# Patient Record
Sex: Female | Born: 1960
Health system: Southern US, Community
[De-identification: ages and names within clinical notes are randomized; demographics above are authoritative.]

---

## 1998-05-05 ENCOUNTER — Emergency Department (HOSPITAL_COMMUNITY): Admission: EM | Admit: 1998-05-05 | Discharge: 1998-05-05 | Payer: Self-pay | Admitting: Emergency Medicine

## 1998-09-06 ENCOUNTER — Emergency Department (HOSPITAL_COMMUNITY): Admission: EM | Admit: 1998-09-06 | Discharge: 1998-09-06 | Payer: Self-pay | Admitting: Emergency Medicine

## 2006-02-14 ENCOUNTER — Emergency Department (HOSPITAL_COMMUNITY): Admission: EM | Admit: 2006-02-14 | Discharge: 2006-02-14 | Payer: Self-pay | Admitting: Emergency Medicine

## 2011-12-19 DIAGNOSIS — M25569 Pain in unspecified knee: Secondary | ICD-10-CM | POA: Diagnosis not present

## 2011-12-26 DIAGNOSIS — M25569 Pain in unspecified knee: Secondary | ICD-10-CM | POA: Diagnosis not present

## 2012-01-16 DIAGNOSIS — G243 Spasmodic torticollis: Secondary | ICD-10-CM | POA: Diagnosis not present

## 2012-01-16 DIAGNOSIS — G244 Idiopathic orofacial dystonia: Secondary | ICD-10-CM | POA: Diagnosis not present

## 2012-03-19 DIAGNOSIS — G243 Spasmodic torticollis: Secondary | ICD-10-CM | POA: Diagnosis not present

## 2012-03-19 DIAGNOSIS — M436 Torticollis: Secondary | ICD-10-CM | POA: Diagnosis not present

## 2012-04-18 DIAGNOSIS — A630 Anogenital (venereal) warts: Secondary | ICD-10-CM | POA: Diagnosis not present

## 2012-07-02 DIAGNOSIS — G243 Spasmodic torticollis: Secondary | ICD-10-CM | POA: Diagnosis not present

## 2012-10-01 DIAGNOSIS — G243 Spasmodic torticollis: Secondary | ICD-10-CM | POA: Diagnosis not present

## 2013-01-21 DIAGNOSIS — R6889 Other general symptoms and signs: Secondary | ICD-10-CM | POA: Diagnosis not present

## 2013-01-21 DIAGNOSIS — G243 Spasmodic torticollis: Secondary | ICD-10-CM | POA: Diagnosis not present

## 2013-02-13 DIAGNOSIS — E559 Vitamin D deficiency, unspecified: Secondary | ICD-10-CM | POA: Diagnosis not present

## 2013-02-13 DIAGNOSIS — E7800 Pure hypercholesterolemia, unspecified: Secondary | ICD-10-CM | POA: Diagnosis not present

## 2013-02-13 DIAGNOSIS — I10 Essential (primary) hypertension: Secondary | ICD-10-CM | POA: Diagnosis not present

## 2013-04-15 DIAGNOSIS — R6889 Other general symptoms and signs: Secondary | ICD-10-CM | POA: Diagnosis not present

## 2013-04-15 DIAGNOSIS — G243 Spasmodic torticollis: Secondary | ICD-10-CM | POA: Diagnosis not present

## 2013-05-24 DIAGNOSIS — E559 Vitamin D deficiency, unspecified: Secondary | ICD-10-CM | POA: Diagnosis not present

## 2013-05-24 DIAGNOSIS — I10 Essential (primary) hypertension: Secondary | ICD-10-CM | POA: Diagnosis not present

## 2013-05-24 DIAGNOSIS — E7800 Pure hypercholesterolemia, unspecified: Secondary | ICD-10-CM | POA: Diagnosis not present

## 2013-05-24 DIAGNOSIS — R7989 Other specified abnormal findings of blood chemistry: Secondary | ICD-10-CM | POA: Diagnosis not present

## 2013-05-30 DIAGNOSIS — E7800 Pure hypercholesterolemia, unspecified: Secondary | ICD-10-CM | POA: Diagnosis not present

## 2013-05-30 DIAGNOSIS — E559 Vitamin D deficiency, unspecified: Secondary | ICD-10-CM | POA: Diagnosis not present

## 2013-06-19 DIAGNOSIS — H1045 Other chronic allergic conjunctivitis: Secondary | ICD-10-CM | POA: Diagnosis not present

## 2013-08-19 DIAGNOSIS — R6889 Other general symptoms and signs: Secondary | ICD-10-CM | POA: Diagnosis not present

## 2013-08-19 DIAGNOSIS — G243 Spasmodic torticollis: Secondary | ICD-10-CM | POA: Diagnosis not present

## 2013-09-20 DIAGNOSIS — I10 Essential (primary) hypertension: Secondary | ICD-10-CM | POA: Diagnosis not present

## 2013-09-20 DIAGNOSIS — E7800 Pure hypercholesterolemia, unspecified: Secondary | ICD-10-CM | POA: Diagnosis not present

## 2013-09-20 DIAGNOSIS — E559 Vitamin D deficiency, unspecified: Secondary | ICD-10-CM | POA: Diagnosis not present

## 2013-10-08 DIAGNOSIS — E559 Vitamin D deficiency, unspecified: Secondary | ICD-10-CM | POA: Diagnosis not present

## 2013-10-08 DIAGNOSIS — K219 Gastro-esophageal reflux disease without esophagitis: Secondary | ICD-10-CM | POA: Diagnosis not present

## 2013-10-08 DIAGNOSIS — E7800 Pure hypercholesterolemia, unspecified: Secondary | ICD-10-CM | POA: Diagnosis not present

## 2013-10-08 DIAGNOSIS — I10 Essential (primary) hypertension: Secondary | ICD-10-CM | POA: Diagnosis not present

## 2013-11-11 DIAGNOSIS — R6889 Other general symptoms and signs: Secondary | ICD-10-CM | POA: Diagnosis not present

## 2013-11-11 DIAGNOSIS — G243 Spasmodic torticollis: Secondary | ICD-10-CM | POA: Diagnosis not present

## 2014-01-09 DIAGNOSIS — E559 Vitamin D deficiency, unspecified: Secondary | ICD-10-CM | POA: Diagnosis not present

## 2014-01-09 DIAGNOSIS — E7800 Pure hypercholesterolemia, unspecified: Secondary | ICD-10-CM | POA: Diagnosis not present

## 2014-01-09 DIAGNOSIS — I10 Essential (primary) hypertension: Secondary | ICD-10-CM | POA: Diagnosis not present

## 2014-01-24 DIAGNOSIS — I10 Essential (primary) hypertension: Secondary | ICD-10-CM | POA: Diagnosis not present

## 2014-01-24 DIAGNOSIS — E7800 Pure hypercholesterolemia, unspecified: Secondary | ICD-10-CM | POA: Diagnosis not present

## 2014-01-24 DIAGNOSIS — R7989 Other specified abnormal findings of blood chemistry: Secondary | ICD-10-CM | POA: Diagnosis not present

## 2014-02-10 DIAGNOSIS — G243 Spasmodic torticollis: Secondary | ICD-10-CM | POA: Diagnosis not present

## 2014-02-10 DIAGNOSIS — R6889 Other general symptoms and signs: Secondary | ICD-10-CM | POA: Diagnosis not present

## 2014-02-11 DIAGNOSIS — E785 Hyperlipidemia, unspecified: Secondary | ICD-10-CM | POA: Diagnosis not present

## 2014-02-11 DIAGNOSIS — E781 Pure hyperglyceridemia: Secondary | ICD-10-CM | POA: Diagnosis not present

## 2014-02-11 DIAGNOSIS — R079 Chest pain, unspecified: Secondary | ICD-10-CM | POA: Diagnosis not present

## 2014-02-11 DIAGNOSIS — I10 Essential (primary) hypertension: Secondary | ICD-10-CM | POA: Diagnosis not present

## 2014-02-18 DIAGNOSIS — R079 Chest pain, unspecified: Secondary | ICD-10-CM | POA: Diagnosis not present

## 2014-02-18 DIAGNOSIS — R011 Cardiac murmur, unspecified: Secondary | ICD-10-CM | POA: Diagnosis not present

## 2014-03-31 DIAGNOSIS — E781 Pure hyperglyceridemia: Secondary | ICD-10-CM | POA: Diagnosis not present

## 2014-03-31 DIAGNOSIS — I10 Essential (primary) hypertension: Secondary | ICD-10-CM | POA: Diagnosis not present

## 2014-03-31 DIAGNOSIS — E785 Hyperlipidemia, unspecified: Secondary | ICD-10-CM | POA: Diagnosis not present

## 2014-05-19 DIAGNOSIS — R6889 Other general symptoms and signs: Secondary | ICD-10-CM | POA: Diagnosis not present

## 2014-05-19 DIAGNOSIS — G243 Spasmodic torticollis: Secondary | ICD-10-CM | POA: Diagnosis not present

## 2014-07-03 DIAGNOSIS — L259 Unspecified contact dermatitis, unspecified cause: Secondary | ICD-10-CM | POA: Diagnosis not present

## 2014-07-24 DIAGNOSIS — B009 Herpesviral infection, unspecified: Secondary | ICD-10-CM | POA: Diagnosis not present

## 2014-08-11 DIAGNOSIS — G243 Spasmodic torticollis: Secondary | ICD-10-CM | POA: Diagnosis not present

## 2014-08-15 DIAGNOSIS — Z1231 Encounter for screening mammogram for malignant neoplasm of breast: Secondary | ICD-10-CM | POA: Diagnosis not present

## 2014-09-12 DIAGNOSIS — Z23 Encounter for immunization: Secondary | ICD-10-CM | POA: Diagnosis not present

## 2014-09-12 DIAGNOSIS — E780 Pure hypercholesterolemia: Secondary | ICD-10-CM | POA: Diagnosis not present

## 2014-09-12 DIAGNOSIS — I10 Essential (primary) hypertension: Secondary | ICD-10-CM | POA: Diagnosis not present

## 2014-09-12 DIAGNOSIS — F329 Major depressive disorder, single episode, unspecified: Secondary | ICD-10-CM | POA: Diagnosis not present

## 2014-09-22 DIAGNOSIS — I10 Essential (primary) hypertension: Secondary | ICD-10-CM | POA: Diagnosis not present

## 2014-09-22 DIAGNOSIS — E785 Hyperlipidemia, unspecified: Secondary | ICD-10-CM | POA: Diagnosis not present

## 2014-11-17 DIAGNOSIS — G243 Spasmodic torticollis: Secondary | ICD-10-CM | POA: Diagnosis not present

## 2015-02-23 DIAGNOSIS — G243 Spasmodic torticollis: Secondary | ICD-10-CM | POA: Diagnosis not present

## 2015-05-18 DIAGNOSIS — G243 Spasmodic torticollis: Secondary | ICD-10-CM | POA: Diagnosis not present

## 2015-09-07 DIAGNOSIS — G243 Spasmodic torticollis: Secondary | ICD-10-CM | POA: Diagnosis not present

## 2015-11-19 DIAGNOSIS — L989 Disorder of the skin and subcutaneous tissue, unspecified: Secondary | ICD-10-CM | POA: Diagnosis not present

## 2015-11-19 DIAGNOSIS — E663 Overweight: Secondary | ICD-10-CM | POA: Diagnosis not present

## 2015-11-19 DIAGNOSIS — E7800 Pure hypercholesterolemia, unspecified: Secondary | ICD-10-CM | POA: Diagnosis not present

## 2015-11-19 DIAGNOSIS — Z6826 Body mass index (BMI) 26.0-26.9, adult: Secondary | ICD-10-CM | POA: Diagnosis not present

## 2015-11-19 DIAGNOSIS — I10 Essential (primary) hypertension: Secondary | ICD-10-CM | POA: Diagnosis not present

## 2015-11-19 DIAGNOSIS — Z23 Encounter for immunization: Secondary | ICD-10-CM | POA: Diagnosis not present

## 2016-01-04 DIAGNOSIS — G243 Spasmodic torticollis: Secondary | ICD-10-CM | POA: Diagnosis not present

## 2016-03-14 DIAGNOSIS — G243 Spasmodic torticollis: Secondary | ICD-10-CM | POA: Diagnosis not present

## 2016-04-01 DIAGNOSIS — E663 Overweight: Secondary | ICD-10-CM | POA: Diagnosis not present

## 2016-04-01 DIAGNOSIS — Z6825 Body mass index (BMI) 25.0-25.9, adult: Secondary | ICD-10-CM | POA: Diagnosis not present

## 2016-04-01 DIAGNOSIS — J302 Other seasonal allergic rhinitis: Secondary | ICD-10-CM | POA: Diagnosis not present

## 2016-04-01 DIAGNOSIS — R195 Other fecal abnormalities: Secondary | ICD-10-CM | POA: Diagnosis not present

## 2016-05-18 DIAGNOSIS — Z1389 Encounter for screening for other disorder: Secondary | ICD-10-CM | POA: Diagnosis not present

## 2016-05-18 DIAGNOSIS — I10 Essential (primary) hypertension: Secondary | ICD-10-CM | POA: Diagnosis not present

## 2016-05-18 DIAGNOSIS — Z139 Encounter for screening, unspecified: Secondary | ICD-10-CM | POA: Diagnosis not present

## 2016-05-18 DIAGNOSIS — Z6826 Body mass index (BMI) 26.0-26.9, adult: Secondary | ICD-10-CM | POA: Diagnosis not present

## 2016-05-18 DIAGNOSIS — Z0000 Encounter for general adult medical examination without abnormal findings: Secondary | ICD-10-CM | POA: Diagnosis not present

## 2016-05-18 DIAGNOSIS — E7800 Pure hypercholesterolemia, unspecified: Secondary | ICD-10-CM | POA: Diagnosis not present

## 2016-06-06 DIAGNOSIS — G243 Spasmodic torticollis: Secondary | ICD-10-CM | POA: Diagnosis not present

## 2016-06-15 DIAGNOSIS — Z1231 Encounter for screening mammogram for malignant neoplasm of breast: Secondary | ICD-10-CM | POA: Diagnosis not present

## 2016-09-12 DIAGNOSIS — G243 Spasmodic torticollis: Secondary | ICD-10-CM | POA: Diagnosis not present

## 2016-10-25 DIAGNOSIS — E663 Overweight: Secondary | ICD-10-CM | POA: Diagnosis not present

## 2016-10-25 DIAGNOSIS — Z6828 Body mass index (BMI) 28.0-28.9, adult: Secondary | ICD-10-CM | POA: Diagnosis not present

## 2016-10-25 DIAGNOSIS — F329 Major depressive disorder, single episode, unspecified: Secondary | ICD-10-CM | POA: Diagnosis not present

## 2016-11-11 DIAGNOSIS — R21 Rash and other nonspecific skin eruption: Secondary | ICD-10-CM | POA: Diagnosis not present

## 2016-11-11 DIAGNOSIS — R233 Spontaneous ecchymoses: Secondary | ICD-10-CM | POA: Diagnosis not present

## 2016-11-30 DIAGNOSIS — E663 Overweight: Secondary | ICD-10-CM | POA: Diagnosis not present

## 2016-11-30 DIAGNOSIS — Z6829 Body mass index (BMI) 29.0-29.9, adult: Secondary | ICD-10-CM | POA: Diagnosis not present

## 2016-11-30 DIAGNOSIS — Z09 Encounter for follow-up examination after completed treatment for conditions other than malignant neoplasm: Secondary | ICD-10-CM | POA: Diagnosis not present

## 2016-12-21 DIAGNOSIS — R945 Abnormal results of liver function studies: Secondary | ICD-10-CM | POA: Diagnosis not present

## 2016-12-21 DIAGNOSIS — R7989 Other specified abnormal findings of blood chemistry: Secondary | ICD-10-CM | POA: Diagnosis not present

## 2017-01-02 DIAGNOSIS — G243 Spasmodic torticollis: Secondary | ICD-10-CM | POA: Diagnosis not present

## 2017-01-25 DIAGNOSIS — E663 Overweight: Secondary | ICD-10-CM | POA: Diagnosis not present

## 2017-01-25 DIAGNOSIS — R7309 Other abnormal glucose: Secondary | ICD-10-CM | POA: Diagnosis not present

## 2017-01-25 DIAGNOSIS — Z6828 Body mass index (BMI) 28.0-28.9, adult: Secondary | ICD-10-CM | POA: Diagnosis not present

## 2017-01-25 DIAGNOSIS — I10 Essential (primary) hypertension: Secondary | ICD-10-CM | POA: Diagnosis not present

## 2017-01-25 DIAGNOSIS — Z23 Encounter for immunization: Secondary | ICD-10-CM | POA: Diagnosis not present

## 2017-02-17 DIAGNOSIS — R945 Abnormal results of liver function studies: Secondary | ICD-10-CM | POA: Diagnosis not present

## 2017-02-17 DIAGNOSIS — N183 Chronic kidney disease, stage 3 (moderate): Secondary | ICD-10-CM | POA: Diagnosis not present

## 2017-02-22 ENCOUNTER — Other Ambulatory Visit: Payer: Self-pay | Admitting: Nephrology

## 2017-02-22 DIAGNOSIS — N183 Chronic kidney disease, stage 3 unspecified: Secondary | ICD-10-CM

## 2017-03-06 ENCOUNTER — Other Ambulatory Visit: Payer: Self-pay

## 2017-03-13 ENCOUNTER — Other Ambulatory Visit: Payer: Self-pay | Admitting: Nephrology

## 2017-03-13 ENCOUNTER — Ambulatory Visit
Admission: RE | Admit: 2017-03-13 | Discharge: 2017-03-13 | Disposition: A | Discharge status: Home / Self Care | Payer: Medicare Other | Source: Ambulatory Visit | Attending: Nephrology | Admitting: Nephrology

## 2017-03-13 DIAGNOSIS — G243 Spasmodic torticollis: Secondary | ICD-10-CM | POA: Diagnosis not present

## 2017-03-13 DIAGNOSIS — R748 Abnormal levels of other serum enzymes: Secondary | ICD-10-CM

## 2017-03-13 DIAGNOSIS — N183 Chronic kidney disease, stage 3 unspecified: Secondary | ICD-10-CM

## 2017-03-13 DIAGNOSIS — R945 Abnormal results of liver function studies: Secondary | ICD-10-CM | POA: Diagnosis not present

## 2017-05-10 DIAGNOSIS — K219 Gastro-esophageal reflux disease without esophagitis: Secondary | ICD-10-CM | POA: Diagnosis not present

## 2017-05-10 DIAGNOSIS — E119 Type 2 diabetes mellitus without complications: Secondary | ICD-10-CM | POA: Diagnosis not present

## 2017-05-10 DIAGNOSIS — F329 Major depressive disorder, single episode, unspecified: Secondary | ICD-10-CM | POA: Diagnosis not present

## 2017-05-10 DIAGNOSIS — Z6826 Body mass index (BMI) 26.0-26.9, adult: Secondary | ICD-10-CM | POA: Diagnosis not present

## 2017-05-10 DIAGNOSIS — I10 Essential (primary) hypertension: Secondary | ICD-10-CM | POA: Diagnosis not present

## 2017-06-28 DIAGNOSIS — G243 Spasmodic torticollis: Secondary | ICD-10-CM | POA: Diagnosis not present

## 2017-07-25 DIAGNOSIS — Z79899 Other long term (current) drug therapy: Secondary | ICD-10-CM | POA: Diagnosis not present

## 2017-07-25 DIAGNOSIS — G243 Spasmodic torticollis: Secondary | ICD-10-CM | POA: Diagnosis not present

## 2017-07-25 DIAGNOSIS — K6289 Other specified diseases of anus and rectum: Secondary | ICD-10-CM | POA: Diagnosis not present

## 2017-07-25 DIAGNOSIS — K5900 Constipation, unspecified: Secondary | ICD-10-CM | POA: Diagnosis not present

## 2017-07-25 DIAGNOSIS — R739 Hyperglycemia, unspecified: Secondary | ICD-10-CM | POA: Diagnosis not present

## 2017-07-25 DIAGNOSIS — E785 Hyperlipidemia, unspecified: Secondary | ICD-10-CM | POA: Diagnosis not present

## 2017-07-25 DIAGNOSIS — F329 Major depressive disorder, single episode, unspecified: Secondary | ICD-10-CM | POA: Diagnosis not present

## 2017-07-25 DIAGNOSIS — K219 Gastro-esophageal reflux disease without esophagitis: Secondary | ICD-10-CM | POA: Diagnosis not present

## 2017-07-25 DIAGNOSIS — I10 Essential (primary) hypertension: Secondary | ICD-10-CM | POA: Diagnosis not present

## 2017-09-05 DIAGNOSIS — Z1231 Encounter for screening mammogram for malignant neoplasm of breast: Secondary | ICD-10-CM | POA: Diagnosis not present

## 2017-10-02 DIAGNOSIS — E785 Hyperlipidemia, unspecified: Secondary | ICD-10-CM | POA: Diagnosis not present

## 2017-10-02 DIAGNOSIS — I10 Essential (primary) hypertension: Secondary | ICD-10-CM | POA: Diagnosis not present

## 2017-10-24 DIAGNOSIS — I10 Essential (primary) hypertension: Secondary | ICD-10-CM | POA: Diagnosis not present

## 2017-10-24 DIAGNOSIS — R739 Hyperglycemia, unspecified: Secondary | ICD-10-CM | POA: Diagnosis not present

## 2017-10-24 DIAGNOSIS — K219 Gastro-esophageal reflux disease without esophagitis: Secondary | ICD-10-CM | POA: Diagnosis not present

## 2017-10-24 DIAGNOSIS — Z23 Encounter for immunization: Secondary | ICD-10-CM | POA: Diagnosis not present

## 2017-10-24 DIAGNOSIS — F329 Major depressive disorder, single episode, unspecified: Secondary | ICD-10-CM | POA: Diagnosis not present

## 2017-10-24 DIAGNOSIS — E785 Hyperlipidemia, unspecified: Secondary | ICD-10-CM | POA: Diagnosis not present

## 2017-10-24 DIAGNOSIS — E559 Vitamin D deficiency, unspecified: Secondary | ICD-10-CM | POA: Diagnosis not present

## 2017-11-06 DIAGNOSIS — G243 Spasmodic torticollis: Secondary | ICD-10-CM | POA: Diagnosis not present

## 2017-12-14 DIAGNOSIS — E785 Hyperlipidemia, unspecified: Secondary | ICD-10-CM | POA: Diagnosis not present

## 2018-02-09 DIAGNOSIS — E785 Hyperlipidemia, unspecified: Secondary | ICD-10-CM | POA: Diagnosis not present

## 2018-02-12 DIAGNOSIS — G243 Spasmodic torticollis: Secondary | ICD-10-CM | POA: Diagnosis not present

## 2018-02-14 DIAGNOSIS — E785 Hyperlipidemia, unspecified: Secondary | ICD-10-CM | POA: Diagnosis not present

## 2018-02-14 DIAGNOSIS — Z8249 Family history of ischemic heart disease and other diseases of the circulatory system: Secondary | ICD-10-CM | POA: Diagnosis not present

## 2018-02-14 DIAGNOSIS — I10 Essential (primary) hypertension: Secondary | ICD-10-CM | POA: Diagnosis not present

## 2018-04-26 DIAGNOSIS — R7303 Prediabetes: Secondary | ICD-10-CM | POA: Diagnosis not present

## 2018-04-26 DIAGNOSIS — F329 Major depressive disorder, single episode, unspecified: Secondary | ICD-10-CM | POA: Diagnosis not present

## 2018-04-26 DIAGNOSIS — E785 Hyperlipidemia, unspecified: Secondary | ICD-10-CM | POA: Diagnosis not present

## 2018-04-26 DIAGNOSIS — I10 Essential (primary) hypertension: Secondary | ICD-10-CM | POA: Diagnosis not present

## 2018-04-26 DIAGNOSIS — K219 Gastro-esophageal reflux disease without esophagitis: Secondary | ICD-10-CM | POA: Diagnosis not present

## 2018-05-28 DIAGNOSIS — G243 Spasmodic torticollis: Secondary | ICD-10-CM | POA: Diagnosis not present

## 2018-05-28 DIAGNOSIS — Z79899 Other long term (current) drug therapy: Secondary | ICD-10-CM | POA: Diagnosis not present

## 2018-06-12 DIAGNOSIS — Z1231 Encounter for screening mammogram for malignant neoplasm of breast: Secondary | ICD-10-CM | POA: Diagnosis not present

## 2018-06-12 DIAGNOSIS — Z136 Encounter for screening for cardiovascular disorders: Secondary | ICD-10-CM | POA: Diagnosis not present

## 2018-06-12 DIAGNOSIS — Z0000 Encounter for general adult medical examination without abnormal findings: Secondary | ICD-10-CM | POA: Diagnosis not present

## 2018-06-12 DIAGNOSIS — N959 Unspecified menopausal and perimenopausal disorder: Secondary | ICD-10-CM | POA: Diagnosis not present

## 2018-06-12 DIAGNOSIS — Z9181 History of falling: Secondary | ICD-10-CM | POA: Diagnosis not present

## 2018-06-12 DIAGNOSIS — Z1331 Encounter for screening for depression: Secondary | ICD-10-CM | POA: Diagnosis not present

## 2018-06-12 DIAGNOSIS — E785 Hyperlipidemia, unspecified: Secondary | ICD-10-CM | POA: Diagnosis not present

## 2018-08-20 DIAGNOSIS — Z8249 Family history of ischemic heart disease and other diseases of the circulatory system: Secondary | ICD-10-CM | POA: Diagnosis not present

## 2018-08-20 DIAGNOSIS — E785 Hyperlipidemia, unspecified: Secondary | ICD-10-CM | POA: Diagnosis not present

## 2018-08-20 DIAGNOSIS — I10 Essential (primary) hypertension: Secondary | ICD-10-CM | POA: Diagnosis not present

## 2018-09-18 DIAGNOSIS — G243 Spasmodic torticollis: Secondary | ICD-10-CM | POA: Diagnosis not present

## 2018-10-01 DIAGNOSIS — Z1231 Encounter for screening mammogram for malignant neoplasm of breast: Secondary | ICD-10-CM | POA: Diagnosis not present

## 2018-10-01 DIAGNOSIS — Z13820 Encounter for screening for osteoporosis: Secondary | ICD-10-CM | POA: Diagnosis not present

## 2018-10-01 DIAGNOSIS — N959 Unspecified menopausal and perimenopausal disorder: Secondary | ICD-10-CM | POA: Diagnosis not present

## 2018-12-04 IMAGING — US US ABDOMEN LIMITED
1 series · 14 of 25 positions shown · non-contrast
Comparison: Ultrasound of the abdomen of 12/21/2016

CLINICAL DATA: Elevated liver function tests, prior
cholecystectomy. New diagnosis of diabetes

EXAM:
US ABDOMEN LIMITED - RIGHT UPPER QUADRANT

[Series 1: us abdomen limited · 0.22mm/px · 14 of 32 slices shown]
[im 1/32]
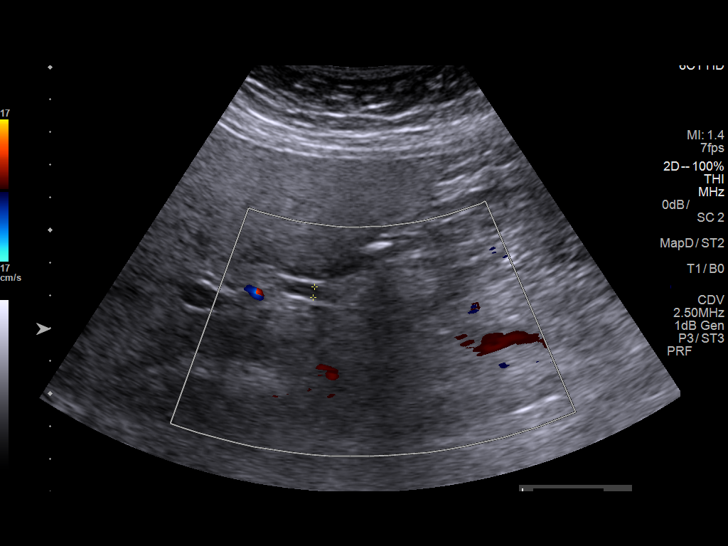
[im 3/32]
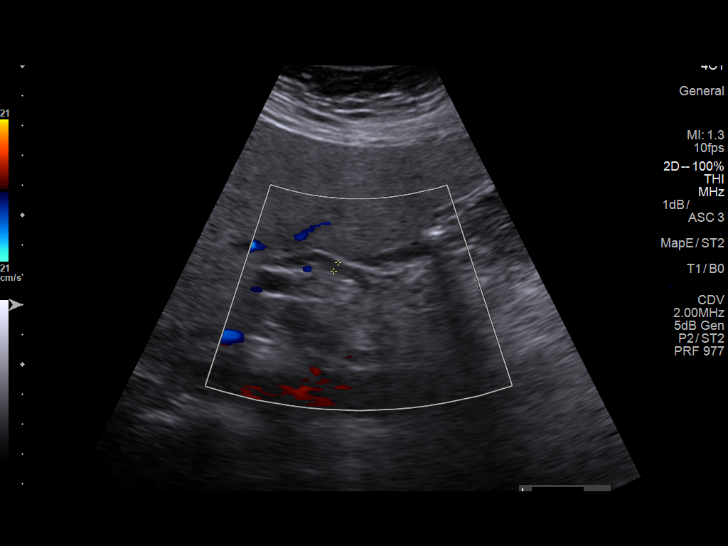
[im 6/32]
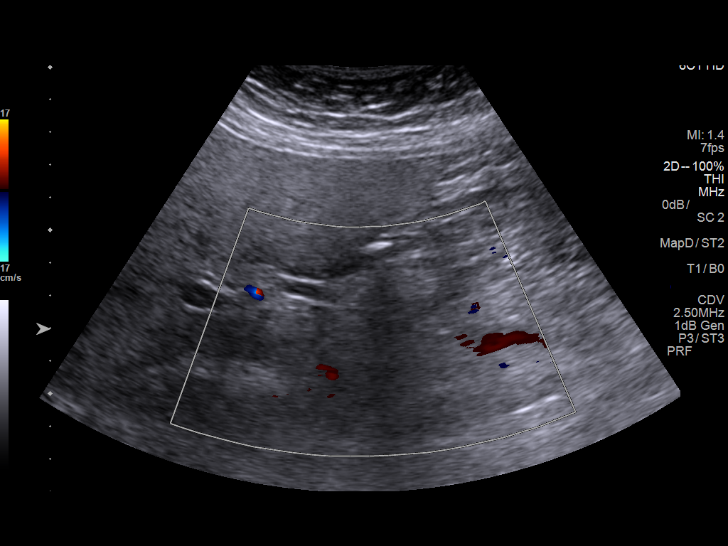
[im 8/32]
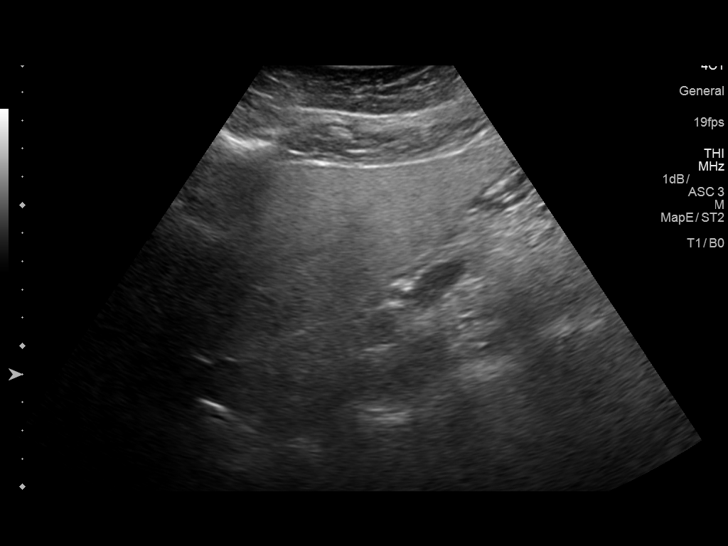
[im 11/32]
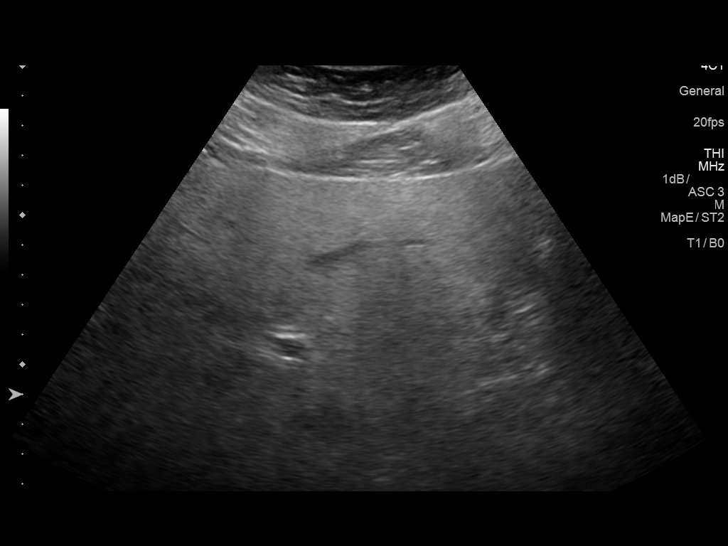
[im 12/32]
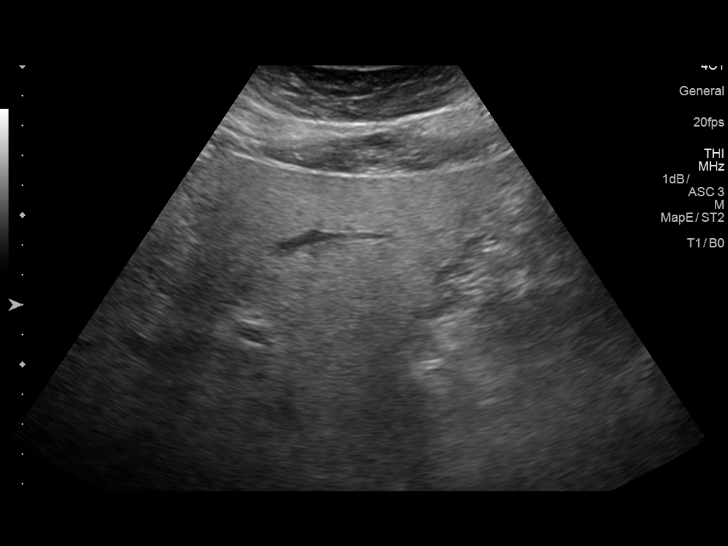
[im 15/32]
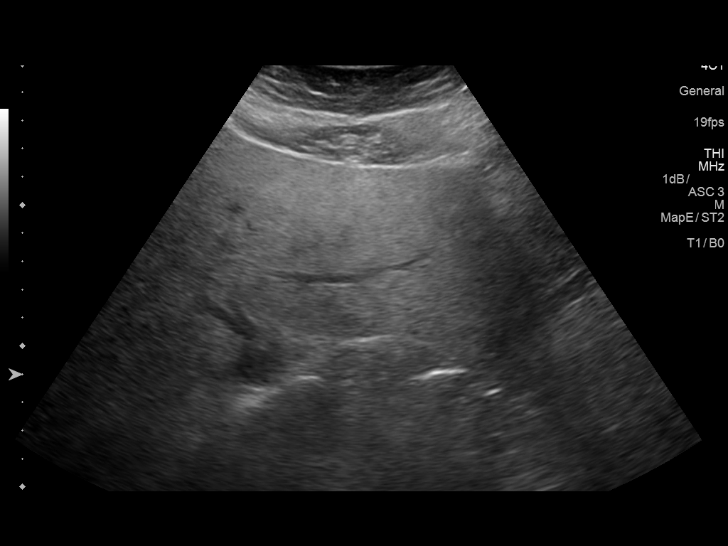
[im 17/32]
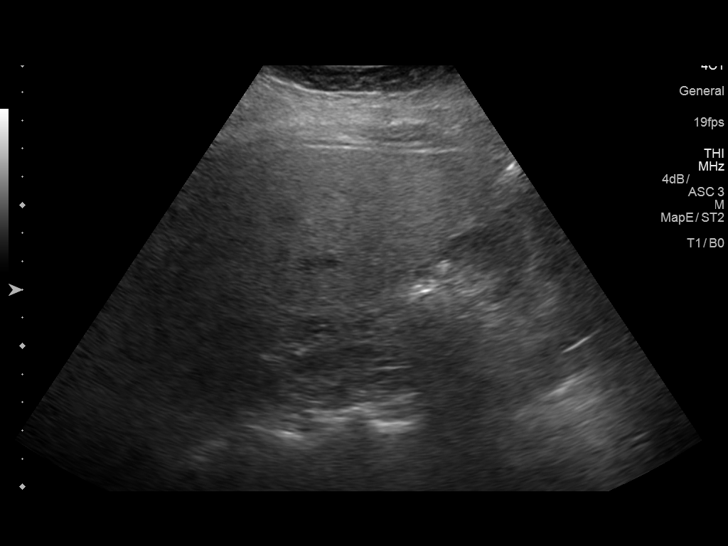
[im 20/32]
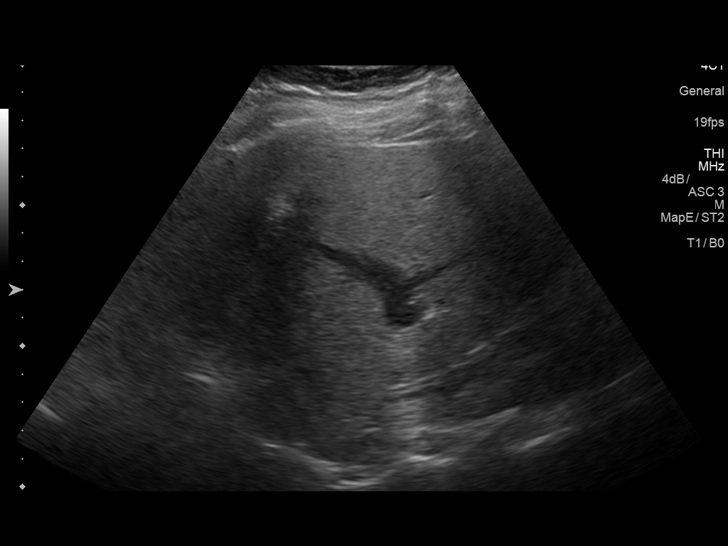
[im 21/32]
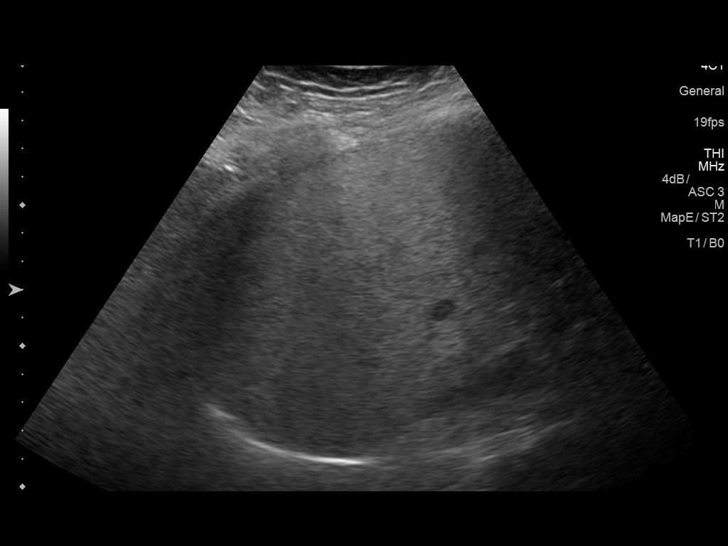
[im 24/32]
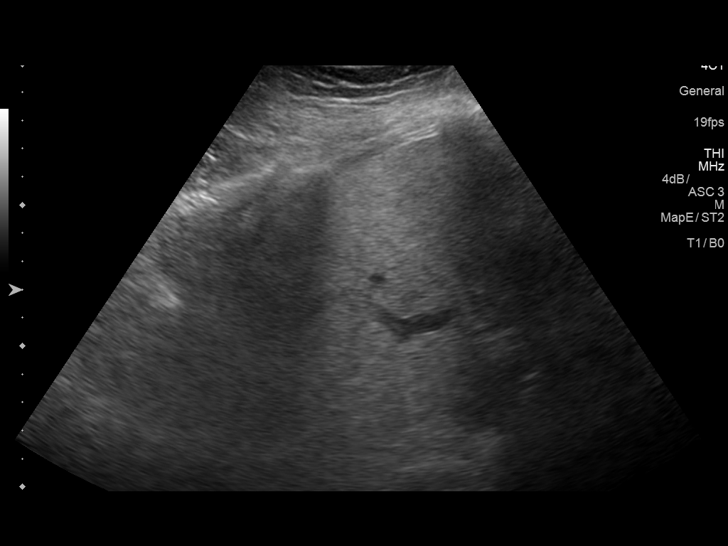
[im 26/32]
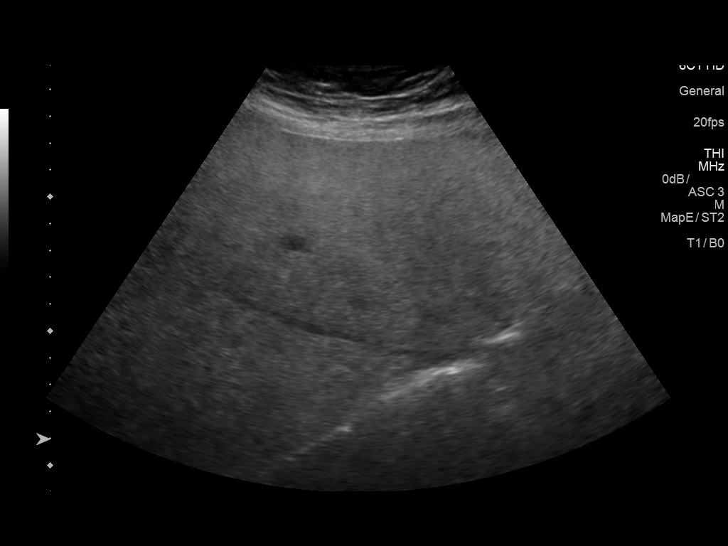
[im 29/32]
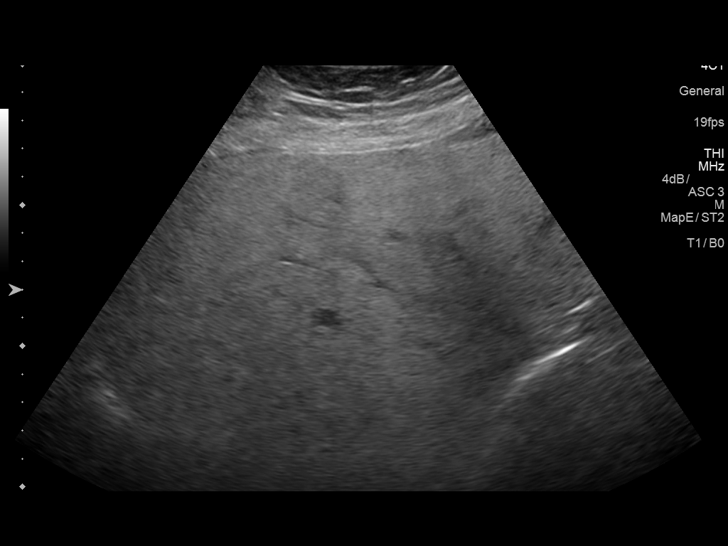
[im 32/32]
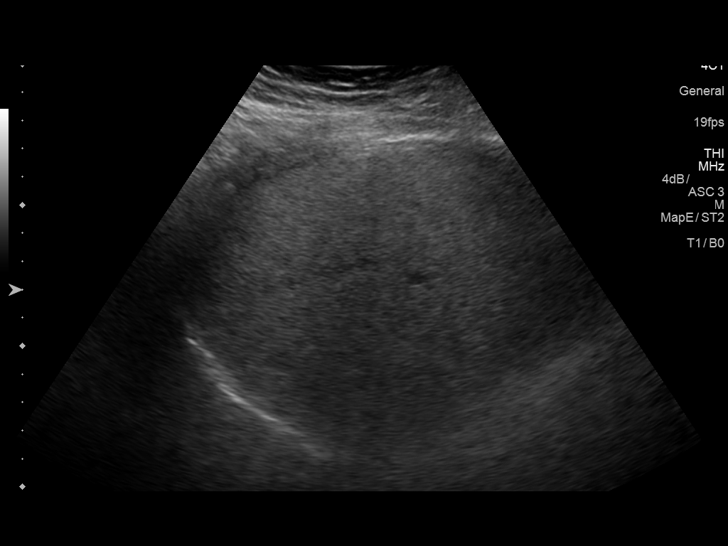

[14 of 25 positions shown; findings below may reference images not displayed]

FINDINGS: Gallbladder:

The gallbladder has previously been resected.

Common bile duct:

Diameter: The common bile duct is normal measuring 3.3 mm.

Liver:

The liver is diffusely echogenic and inhomogeneous consistent with
fatty infiltration. No focal hepatic abnormality is seen.
IMPRESSION: 1. Inhomogeneous echogenic liver parenchyma consistent with diffuse
fatty infiltration. No focal abnormality.
2. Prior cholecystectomy.

## 2018-12-10 DIAGNOSIS — R062 Wheezing: Secondary | ICD-10-CM | POA: Diagnosis not present

## 2018-12-10 DIAGNOSIS — J209 Acute bronchitis, unspecified: Secondary | ICD-10-CM | POA: Diagnosis not present

## 2018-12-10 DIAGNOSIS — R05 Cough: Secondary | ICD-10-CM | POA: Diagnosis not present

## 2019-01-17 DIAGNOSIS — G243 Spasmodic torticollis: Secondary | ICD-10-CM | POA: Diagnosis not present

## 2019-01-17 DIAGNOSIS — Z79899 Other long term (current) drug therapy: Secondary | ICD-10-CM | POA: Diagnosis not present

## 2019-05-07 DIAGNOSIS — G243 Spasmodic torticollis: Secondary | ICD-10-CM | POA: Diagnosis not present

## 2019-05-07 DIAGNOSIS — Z79899 Other long term (current) drug therapy: Secondary | ICD-10-CM | POA: Diagnosis not present

## 2019-05-16 DIAGNOSIS — Z9229 Personal history of other drug therapy: Secondary | ICD-10-CM | POA: Diagnosis not present

## 2019-05-16 DIAGNOSIS — Z79899 Other long term (current) drug therapy: Secondary | ICD-10-CM | POA: Diagnosis not present

## 2019-05-16 DIAGNOSIS — G243 Spasmodic torticollis: Secondary | ICD-10-CM | POA: Diagnosis not present

## 2019-05-21 DIAGNOSIS — E559 Vitamin D deficiency, unspecified: Secondary | ICD-10-CM | POA: Diagnosis not present

## 2019-05-21 DIAGNOSIS — K219 Gastro-esophageal reflux disease without esophagitis: Secondary | ICD-10-CM | POA: Diagnosis not present

## 2019-05-21 DIAGNOSIS — E785 Hyperlipidemia, unspecified: Secondary | ICD-10-CM | POA: Diagnosis not present

## 2019-05-21 DIAGNOSIS — I10 Essential (primary) hypertension: Secondary | ICD-10-CM | POA: Diagnosis not present

## 2019-06-03 DIAGNOSIS — Z79899 Other long term (current) drug therapy: Secondary | ICD-10-CM | POA: Diagnosis not present

## 2019-06-03 DIAGNOSIS — R5383 Other fatigue: Secondary | ICD-10-CM | POA: Diagnosis not present

## 2019-06-03 DIAGNOSIS — E559 Vitamin D deficiency, unspecified: Secondary | ICD-10-CM | POA: Diagnosis not present

## 2019-06-03 DIAGNOSIS — R739 Hyperglycemia, unspecified: Secondary | ICD-10-CM | POA: Diagnosis not present

## 2019-06-03 DIAGNOSIS — I10 Essential (primary) hypertension: Secondary | ICD-10-CM | POA: Diagnosis not present

## 2019-06-03 DIAGNOSIS — E785 Hyperlipidemia, unspecified: Secondary | ICD-10-CM | POA: Diagnosis not present

## 2019-06-03 DIAGNOSIS — K219 Gastro-esophageal reflux disease without esophagitis: Secondary | ICD-10-CM | POA: Diagnosis not present

## 2019-06-17 DIAGNOSIS — Z1331 Encounter for screening for depression: Secondary | ICD-10-CM | POA: Diagnosis not present

## 2019-06-17 DIAGNOSIS — E785 Hyperlipidemia, unspecified: Secondary | ICD-10-CM | POA: Diagnosis not present

## 2019-06-17 DIAGNOSIS — Z9181 History of falling: Secondary | ICD-10-CM | POA: Diagnosis not present

## 2019-06-17 DIAGNOSIS — Z1231 Encounter for screening mammogram for malignant neoplasm of breast: Secondary | ICD-10-CM | POA: Diagnosis not present

## 2019-06-17 DIAGNOSIS — Z0000 Encounter for general adult medical examination without abnormal findings: Secondary | ICD-10-CM | POA: Diagnosis not present

## 2019-08-15 DIAGNOSIS — Z79899 Other long term (current) drug therapy: Secondary | ICD-10-CM | POA: Diagnosis not present

## 2019-08-15 DIAGNOSIS — G243 Spasmodic torticollis: Secondary | ICD-10-CM | POA: Diagnosis not present

## 2019-08-29 DIAGNOSIS — E785 Hyperlipidemia, unspecified: Secondary | ICD-10-CM | POA: Diagnosis not present

## 2019-08-29 DIAGNOSIS — I10 Essential (primary) hypertension: Secondary | ICD-10-CM | POA: Diagnosis not present

## 2019-08-29 DIAGNOSIS — K219 Gastro-esophageal reflux disease without esophagitis: Secondary | ICD-10-CM | POA: Diagnosis not present

## 2019-08-29 DIAGNOSIS — E559 Vitamin D deficiency, unspecified: Secondary | ICD-10-CM | POA: Diagnosis not present

## 2019-08-29 DIAGNOSIS — Z23 Encounter for immunization: Secondary | ICD-10-CM | POA: Diagnosis not present

## 2019-08-29 DIAGNOSIS — R739 Hyperglycemia, unspecified: Secondary | ICD-10-CM | POA: Diagnosis not present

## 2019-08-29 DIAGNOSIS — R5383 Other fatigue: Secondary | ICD-10-CM | POA: Diagnosis not present

## 2019-09-16 DIAGNOSIS — Z8249 Family history of ischemic heart disease and other diseases of the circulatory system: Secondary | ICD-10-CM | POA: Diagnosis not present

## 2019-09-16 DIAGNOSIS — I10 Essential (primary) hypertension: Secondary | ICD-10-CM | POA: Diagnosis not present

## 2019-09-16 DIAGNOSIS — E785 Hyperlipidemia, unspecified: Secondary | ICD-10-CM | POA: Diagnosis not present

## 2019-10-24 DIAGNOSIS — H43393 Other vitreous opacities, bilateral: Secondary | ICD-10-CM | POA: Diagnosis not present

## 2019-10-24 DIAGNOSIS — H25013 Cortical age-related cataract, bilateral: Secondary | ICD-10-CM | POA: Diagnosis not present

## 2020-01-03 DIAGNOSIS — G243 Spasmodic torticollis: Secondary | ICD-10-CM | POA: Diagnosis not present

## 2020-01-14 DIAGNOSIS — H25811 Combined forms of age-related cataract, right eye: Secondary | ICD-10-CM | POA: Diagnosis not present

## 2020-01-14 DIAGNOSIS — Z01818 Encounter for other preprocedural examination: Secondary | ICD-10-CM | POA: Diagnosis not present

## 2020-01-21 DIAGNOSIS — K219 Gastro-esophageal reflux disease without esophagitis: Secondary | ICD-10-CM | POA: Diagnosis not present

## 2020-01-21 DIAGNOSIS — F418 Other specified anxiety disorders: Secondary | ICD-10-CM | POA: Diagnosis not present

## 2020-01-21 DIAGNOSIS — I10 Essential (primary) hypertension: Secondary | ICD-10-CM | POA: Diagnosis not present

## 2020-01-21 DIAGNOSIS — E785 Hyperlipidemia, unspecified: Secondary | ICD-10-CM | POA: Diagnosis not present

## 2020-01-21 DIAGNOSIS — Z79899 Other long term (current) drug therapy: Secondary | ICD-10-CM | POA: Diagnosis not present

## 2020-01-21 DIAGNOSIS — H259 Unspecified age-related cataract: Secondary | ICD-10-CM | POA: Diagnosis not present

## 2020-01-21 DIAGNOSIS — H25811 Combined forms of age-related cataract, right eye: Secondary | ICD-10-CM | POA: Diagnosis not present

## 2020-01-21 DIAGNOSIS — Z961 Presence of intraocular lens: Secondary | ICD-10-CM | POA: Diagnosis not present

## 2020-01-21 DIAGNOSIS — M1990 Unspecified osteoarthritis, unspecified site: Secondary | ICD-10-CM | POA: Diagnosis not present

## 2020-02-18 DIAGNOSIS — E785 Hyperlipidemia, unspecified: Secondary | ICD-10-CM | POA: Diagnosis not present

## 2020-02-18 DIAGNOSIS — E119 Type 2 diabetes mellitus without complications: Secondary | ICD-10-CM | POA: Diagnosis not present

## 2020-02-18 DIAGNOSIS — E559 Vitamin D deficiency, unspecified: Secondary | ICD-10-CM | POA: Diagnosis not present

## 2020-02-18 DIAGNOSIS — K219 Gastro-esophageal reflux disease without esophagitis: Secondary | ICD-10-CM | POA: Diagnosis not present

## 2020-02-18 DIAGNOSIS — I10 Essential (primary) hypertension: Secondary | ICD-10-CM | POA: Diagnosis not present

## 2020-02-25 DIAGNOSIS — Z1212 Encounter for screening for malignant neoplasm of rectum: Secondary | ICD-10-CM | POA: Diagnosis not present

## 2020-03-03 DIAGNOSIS — H25812 Combined forms of age-related cataract, left eye: Secondary | ICD-10-CM | POA: Diagnosis not present

## 2020-03-03 DIAGNOSIS — Z79899 Other long term (current) drug therapy: Secondary | ICD-10-CM | POA: Diagnosis not present

## 2020-03-03 DIAGNOSIS — H52203 Unspecified astigmatism, bilateral: Secondary | ICD-10-CM | POA: Diagnosis not present

## 2020-03-03 DIAGNOSIS — E785 Hyperlipidemia, unspecified: Secondary | ICD-10-CM | POA: Diagnosis not present

## 2020-03-03 DIAGNOSIS — I10 Essential (primary) hypertension: Secondary | ICD-10-CM | POA: Diagnosis not present

## 2020-03-03 DIAGNOSIS — H2512 Age-related nuclear cataract, left eye: Secondary | ICD-10-CM | POA: Diagnosis not present

## 2020-03-03 DIAGNOSIS — H259 Unspecified age-related cataract: Secondary | ICD-10-CM | POA: Diagnosis not present

## 2020-03-03 DIAGNOSIS — M1990 Unspecified osteoarthritis, unspecified site: Secondary | ICD-10-CM | POA: Diagnosis not present

## 2020-04-07 DIAGNOSIS — G243 Spasmodic torticollis: Secondary | ICD-10-CM | POA: Diagnosis not present

## 2020-06-30 DIAGNOSIS — H6123 Impacted cerumen, bilateral: Secondary | ICD-10-CM | POA: Diagnosis not present

## 2020-06-30 DIAGNOSIS — H9193 Unspecified hearing loss, bilateral: Secondary | ICD-10-CM | POA: Diagnosis not present

## 2020-06-30 DIAGNOSIS — Z1231 Encounter for screening mammogram for malignant neoplasm of breast: Secondary | ICD-10-CM | POA: Diagnosis not present

## 2020-07-20 DIAGNOSIS — G243 Spasmodic torticollis: Secondary | ICD-10-CM | POA: Diagnosis not present

## 2020-09-17 DIAGNOSIS — E785 Hyperlipidemia, unspecified: Secondary | ICD-10-CM | POA: Diagnosis not present

## 2020-09-17 DIAGNOSIS — E119 Type 2 diabetes mellitus without complications: Secondary | ICD-10-CM | POA: Diagnosis not present

## 2020-09-17 DIAGNOSIS — K219 Gastro-esophageal reflux disease without esophagitis: Secondary | ICD-10-CM | POA: Diagnosis not present

## 2020-09-17 DIAGNOSIS — E559 Vitamin D deficiency, unspecified: Secondary | ICD-10-CM | POA: Diagnosis not present

## 2020-09-17 DIAGNOSIS — F339 Major depressive disorder, recurrent, unspecified: Secondary | ICD-10-CM | POA: Diagnosis not present

## 2020-09-17 DIAGNOSIS — R5383 Other fatigue: Secondary | ICD-10-CM | POA: Diagnosis not present

## 2020-09-17 DIAGNOSIS — I10 Essential (primary) hypertension: Secondary | ICD-10-CM | POA: Diagnosis not present

## 2020-09-17 DIAGNOSIS — Z23 Encounter for immunization: Secondary | ICD-10-CM | POA: Diagnosis not present

## 2020-09-17 DIAGNOSIS — K047 Periapical abscess without sinus: Secondary | ICD-10-CM | POA: Diagnosis not present

## 2020-09-25 DIAGNOSIS — I10 Essential (primary) hypertension: Secondary | ICD-10-CM | POA: Diagnosis not present

## 2020-09-25 DIAGNOSIS — Z8249 Family history of ischemic heart disease and other diseases of the circulatory system: Secondary | ICD-10-CM | POA: Diagnosis not present

## 2020-09-25 DIAGNOSIS — E785 Hyperlipidemia, unspecified: Secondary | ICD-10-CM | POA: Diagnosis not present

## 2020-10-26 DIAGNOSIS — Z1211 Encounter for screening for malignant neoplasm of colon: Secondary | ICD-10-CM | POA: Diagnosis not present

## 2020-10-30 DIAGNOSIS — G243 Spasmodic torticollis: Secondary | ICD-10-CM | POA: Diagnosis not present

## 2020-10-30 DIAGNOSIS — Z79899 Other long term (current) drug therapy: Secondary | ICD-10-CM | POA: Diagnosis not present

## 2020-11-04 DIAGNOSIS — Z1211 Encounter for screening for malignant neoplasm of colon: Secondary | ICD-10-CM | POA: Diagnosis not present

## 2020-11-04 DIAGNOSIS — R748 Abnormal levels of other serum enzymes: Secondary | ICD-10-CM | POA: Diagnosis not present

## 2020-11-04 DIAGNOSIS — E119 Type 2 diabetes mellitus without complications: Secondary | ICD-10-CM | POA: Diagnosis not present

## 2020-11-04 DIAGNOSIS — Z6827 Body mass index (BMI) 27.0-27.9, adult: Secondary | ICD-10-CM | POA: Diagnosis not present

## 2020-12-14 DIAGNOSIS — Z1211 Encounter for screening for malignant neoplasm of colon: Secondary | ICD-10-CM | POA: Diagnosis not present

## 2020-12-25 DIAGNOSIS — Z20822 Contact with and (suspected) exposure to covid-19: Secondary | ICD-10-CM | POA: Diagnosis not present

## 2020-12-25 DIAGNOSIS — Z01812 Encounter for preprocedural laboratory examination: Secondary | ICD-10-CM | POA: Diagnosis not present

## 2020-12-25 DIAGNOSIS — G243 Spasmodic torticollis: Secondary | ICD-10-CM | POA: Diagnosis not present

## 2020-12-31 DIAGNOSIS — Z79899 Other long term (current) drug therapy: Secondary | ICD-10-CM | POA: Diagnosis not present

## 2020-12-31 DIAGNOSIS — K648 Other hemorrhoids: Secondary | ICD-10-CM | POA: Diagnosis not present

## 2020-12-31 DIAGNOSIS — E119 Type 2 diabetes mellitus without complications: Secondary | ICD-10-CM | POA: Diagnosis not present

## 2020-12-31 DIAGNOSIS — E7800 Pure hypercholesterolemia, unspecified: Secondary | ICD-10-CM | POA: Diagnosis not present

## 2020-12-31 DIAGNOSIS — Z1211 Encounter for screening for malignant neoplasm of colon: Secondary | ICD-10-CM | POA: Diagnosis not present

## 2020-12-31 DIAGNOSIS — I10 Essential (primary) hypertension: Secondary | ICD-10-CM | POA: Diagnosis not present

## 2020-12-31 DIAGNOSIS — Z7984 Long term (current) use of oral hypoglycemic drugs: Secondary | ICD-10-CM | POA: Diagnosis not present

## 2021-02-05 DIAGNOSIS — G249 Dystonia, unspecified: Secondary | ICD-10-CM | POA: Diagnosis not present

## 2021-02-05 DIAGNOSIS — G243 Spasmodic torticollis: Secondary | ICD-10-CM | POA: Diagnosis not present

## 2021-04-05 DIAGNOSIS — F339 Major depressive disorder, recurrent, unspecified: Secondary | ICD-10-CM | POA: Diagnosis not present

## 2021-04-05 DIAGNOSIS — E119 Type 2 diabetes mellitus without complications: Secondary | ICD-10-CM | POA: Diagnosis not present

## 2021-04-05 DIAGNOSIS — I10 Essential (primary) hypertension: Secondary | ICD-10-CM | POA: Diagnosis not present

## 2021-04-05 DIAGNOSIS — K219 Gastro-esophageal reflux disease without esophagitis: Secondary | ICD-10-CM | POA: Diagnosis not present

## 2021-04-05 DIAGNOSIS — E559 Vitamin D deficiency, unspecified: Secondary | ICD-10-CM | POA: Diagnosis not present

## 2021-04-05 DIAGNOSIS — E785 Hyperlipidemia, unspecified: Secondary | ICD-10-CM | POA: Diagnosis not present

## 2021-04-05 DIAGNOSIS — R748 Abnormal levels of other serum enzymes: Secondary | ICD-10-CM | POA: Diagnosis not present

## 2021-04-05 DIAGNOSIS — Z6827 Body mass index (BMI) 27.0-27.9, adult: Secondary | ICD-10-CM | POA: Diagnosis not present

## 2021-04-20 DIAGNOSIS — E119 Type 2 diabetes mellitus without complications: Secondary | ICD-10-CM | POA: Diagnosis not present

## 2021-05-21 DIAGNOSIS — E119 Type 2 diabetes mellitus without complications: Secondary | ICD-10-CM | POA: Diagnosis not present

## 2021-06-09 DIAGNOSIS — K219 Gastro-esophageal reflux disease without esophagitis: Secondary | ICD-10-CM | POA: Diagnosis not present

## 2021-06-09 DIAGNOSIS — R400 Somnolence: Secondary | ICD-10-CM | POA: Diagnosis not present

## 2021-06-09 DIAGNOSIS — R748 Abnormal levels of other serum enzymes: Secondary | ICD-10-CM | POA: Diagnosis not present

## 2021-06-09 DIAGNOSIS — E559 Vitamin D deficiency, unspecified: Secondary | ICD-10-CM | POA: Diagnosis not present

## 2021-06-09 DIAGNOSIS — E785 Hyperlipidemia, unspecified: Secondary | ICD-10-CM | POA: Diagnosis not present

## 2021-06-09 DIAGNOSIS — G4730 Sleep apnea, unspecified: Secondary | ICD-10-CM | POA: Diagnosis not present

## 2021-06-09 DIAGNOSIS — R5383 Other fatigue: Secondary | ICD-10-CM | POA: Diagnosis not present

## 2021-06-09 DIAGNOSIS — E119 Type 2 diabetes mellitus without complications: Secondary | ICD-10-CM | POA: Diagnosis not present

## 2021-06-09 DIAGNOSIS — J329 Chronic sinusitis, unspecified: Secondary | ICD-10-CM | POA: Diagnosis not present

## 2021-06-09 DIAGNOSIS — I10 Essential (primary) hypertension: Secondary | ICD-10-CM | POA: Diagnosis not present

## 2021-06-09 DIAGNOSIS — G243 Spasmodic torticollis: Secondary | ICD-10-CM | POA: Diagnosis not present

## 2021-06-21 DIAGNOSIS — E119 Type 2 diabetes mellitus without complications: Secondary | ICD-10-CM | POA: Diagnosis not present

## 2021-06-24 DIAGNOSIS — E785 Hyperlipidemia, unspecified: Secondary | ICD-10-CM | POA: Diagnosis not present

## 2021-06-24 DIAGNOSIS — Z0000 Encounter for general adult medical examination without abnormal findings: Secondary | ICD-10-CM | POA: Diagnosis not present

## 2021-06-24 DIAGNOSIS — Z9181 History of falling: Secondary | ICD-10-CM | POA: Diagnosis not present

## 2021-07-03 DIAGNOSIS — J3489 Other specified disorders of nose and nasal sinuses: Secondary | ICD-10-CM | POA: Diagnosis not present

## 2021-07-03 DIAGNOSIS — J209 Acute bronchitis, unspecified: Secondary | ICD-10-CM | POA: Diagnosis not present

## 2021-07-03 DIAGNOSIS — J0100 Acute maxillary sinusitis, unspecified: Secondary | ICD-10-CM | POA: Diagnosis not present

## 2021-07-16 ENCOUNTER — Institutional Professional Consult (permissible substitution): Payer: Medicare Other | Admitting: Pulmonary Disease

## 2021-08-06 DIAGNOSIS — G243 Spasmodic torticollis: Secondary | ICD-10-CM | POA: Diagnosis not present

## 2021-08-25 ENCOUNTER — Institutional Professional Consult (permissible substitution): Payer: Medicare Other | Admitting: Pulmonary Disease

## 2021-09-13 DIAGNOSIS — E119 Type 2 diabetes mellitus without complications: Secondary | ICD-10-CM | POA: Diagnosis not present

## 2021-09-28 ENCOUNTER — Institutional Professional Consult (permissible substitution): Payer: Medicare Other | Admitting: Pulmonary Disease

## 2021-10-13 ENCOUNTER — Institutional Professional Consult (permissible substitution): Payer: Medicare Other | Admitting: Pulmonary Disease

## 2021-10-14 DIAGNOSIS — E559 Vitamin D deficiency, unspecified: Secondary | ICD-10-CM | POA: Diagnosis not present

## 2021-10-14 DIAGNOSIS — R400 Somnolence: Secondary | ICD-10-CM | POA: Diagnosis not present

## 2021-10-14 DIAGNOSIS — R748 Abnormal levels of other serum enzymes: Secondary | ICD-10-CM | POA: Diagnosis not present

## 2021-10-14 DIAGNOSIS — R5383 Other fatigue: Secondary | ICD-10-CM | POA: Diagnosis not present

## 2021-10-14 DIAGNOSIS — Z79899 Other long term (current) drug therapy: Secondary | ICD-10-CM | POA: Diagnosis not present

## 2021-10-14 DIAGNOSIS — E119 Type 2 diabetes mellitus without complications: Secondary | ICD-10-CM | POA: Diagnosis not present

## 2021-10-14 DIAGNOSIS — Z23 Encounter for immunization: Secondary | ICD-10-CM | POA: Diagnosis not present

## 2021-10-14 DIAGNOSIS — G243 Spasmodic torticollis: Secondary | ICD-10-CM | POA: Diagnosis not present

## 2021-10-14 DIAGNOSIS — I10 Essential (primary) hypertension: Secondary | ICD-10-CM | POA: Diagnosis not present

## 2021-10-14 DIAGNOSIS — E785 Hyperlipidemia, unspecified: Secondary | ICD-10-CM | POA: Diagnosis not present

## 2021-10-14 DIAGNOSIS — K219 Gastro-esophageal reflux disease without esophagitis: Secondary | ICD-10-CM | POA: Diagnosis not present

## 2021-10-15 DIAGNOSIS — E119 Type 2 diabetes mellitus without complications: Secondary | ICD-10-CM | POA: Diagnosis not present

## 2021-11-02 DIAGNOSIS — G249 Dystonia, unspecified: Secondary | ICD-10-CM | POA: Diagnosis not present

## 2021-11-02 DIAGNOSIS — G243 Spasmodic torticollis: Secondary | ICD-10-CM | POA: Diagnosis not present

## 2021-11-24 DIAGNOSIS — E119 Type 2 diabetes mellitus without complications: Secondary | ICD-10-CM | POA: Diagnosis not present

## 2021-12-02 DIAGNOSIS — E119 Type 2 diabetes mellitus without complications: Secondary | ICD-10-CM | POA: Diagnosis not present

## 2021-12-10 DIAGNOSIS — L03039 Cellulitis of unspecified toe: Secondary | ICD-10-CM | POA: Diagnosis not present

## 2021-12-10 DIAGNOSIS — E119 Type 2 diabetes mellitus without complications: Secondary | ICD-10-CM | POA: Diagnosis not present

## 2021-12-31 DIAGNOSIS — R69 Illness, unspecified: Secondary | ICD-10-CM | POA: Diagnosis not present

## 2022-01-14 DIAGNOSIS — I10 Essential (primary) hypertension: Secondary | ICD-10-CM | POA: Diagnosis not present

## 2022-01-14 DIAGNOSIS — Z8249 Family history of ischemic heart disease and other diseases of the circulatory system: Secondary | ICD-10-CM | POA: Diagnosis not present

## 2022-01-14 DIAGNOSIS — E785 Hyperlipidemia, unspecified: Secondary | ICD-10-CM | POA: Diagnosis not present

## 2022-01-26 DIAGNOSIS — M25561 Pain in right knee: Secondary | ICD-10-CM | POA: Diagnosis not present

## 2022-01-26 DIAGNOSIS — G8929 Other chronic pain: Secondary | ICD-10-CM | POA: Diagnosis not present

## 2022-02-01 DIAGNOSIS — E119 Type 2 diabetes mellitus without complications: Secondary | ICD-10-CM | POA: Diagnosis not present

## 2022-03-04 DIAGNOSIS — G243 Spasmodic torticollis: Secondary | ICD-10-CM | POA: Diagnosis not present

## 2022-04-14 DIAGNOSIS — E785 Hyperlipidemia, unspecified: Secondary | ICD-10-CM | POA: Diagnosis not present

## 2022-04-14 DIAGNOSIS — K219 Gastro-esophageal reflux disease without esophagitis: Secondary | ICD-10-CM | POA: Diagnosis not present

## 2022-04-14 DIAGNOSIS — I10 Essential (primary) hypertension: Secondary | ICD-10-CM | POA: Diagnosis not present

## 2022-04-14 DIAGNOSIS — E119 Type 2 diabetes mellitus without complications: Secondary | ICD-10-CM | POA: Diagnosis not present

## 2022-04-28 DIAGNOSIS — E119 Type 2 diabetes mellitus without complications: Secondary | ICD-10-CM | POA: Diagnosis not present

## 2022-05-25 DIAGNOSIS — H3021 Posterior cyclitis, right eye: Secondary | ICD-10-CM | POA: Diagnosis not present

## 2022-06-28 DIAGNOSIS — H3021 Posterior cyclitis, right eye: Secondary | ICD-10-CM | POA: Diagnosis not present

## 2022-07-05 DIAGNOSIS — Z79899 Other long term (current) drug therapy: Secondary | ICD-10-CM | POA: Diagnosis not present

## 2022-07-05 DIAGNOSIS — G243 Spasmodic torticollis: Secondary | ICD-10-CM | POA: Diagnosis not present

## 2022-07-05 DIAGNOSIS — G249 Dystonia, unspecified: Secondary | ICD-10-CM | POA: Diagnosis not present

## 2022-07-22 DIAGNOSIS — K219 Gastro-esophageal reflux disease without esophagitis: Secondary | ICD-10-CM | POA: Diagnosis not present

## 2022-07-22 DIAGNOSIS — E785 Hyperlipidemia, unspecified: Secondary | ICD-10-CM | POA: Diagnosis not present

## 2022-07-22 DIAGNOSIS — E119 Type 2 diabetes mellitus without complications: Secondary | ICD-10-CM | POA: Diagnosis not present

## 2022-07-22 DIAGNOSIS — J302 Other seasonal allergic rhinitis: Secondary | ICD-10-CM | POA: Diagnosis not present

## 2022-07-22 DIAGNOSIS — I10 Essential (primary) hypertension: Secondary | ICD-10-CM | POA: Diagnosis not present

## 2022-07-30 DIAGNOSIS — E119 Type 2 diabetes mellitus without complications: Secondary | ICD-10-CM | POA: Diagnosis not present

## 2022-08-17 DIAGNOSIS — Z0000 Encounter for general adult medical examination without abnormal findings: Secondary | ICD-10-CM | POA: Diagnosis not present

## 2022-08-17 DIAGNOSIS — Z9181 History of falling: Secondary | ICD-10-CM | POA: Diagnosis not present

## 2022-08-17 DIAGNOSIS — E785 Hyperlipidemia, unspecified: Secondary | ICD-10-CM | POA: Diagnosis not present

## 2022-10-04 DIAGNOSIS — G249 Dystonia, unspecified: Secondary | ICD-10-CM | POA: Diagnosis not present

## 2022-10-04 DIAGNOSIS — G243 Spasmodic torticollis: Secondary | ICD-10-CM | POA: Diagnosis not present

## 2022-10-14 DIAGNOSIS — E119 Type 2 diabetes mellitus without complications: Secondary | ICD-10-CM | POA: Diagnosis not present

## 2022-10-21 DIAGNOSIS — J329 Chronic sinusitis, unspecified: Secondary | ICD-10-CM | POA: Diagnosis not present

## 2022-10-21 DIAGNOSIS — E785 Hyperlipidemia, unspecified: Secondary | ICD-10-CM | POA: Diagnosis not present

## 2022-10-21 DIAGNOSIS — R21 Rash and other nonspecific skin eruption: Secondary | ICD-10-CM | POA: Diagnosis not present

## 2022-10-21 DIAGNOSIS — E119 Type 2 diabetes mellitus without complications: Secondary | ICD-10-CM | POA: Diagnosis not present

## 2022-10-21 DIAGNOSIS — K219 Gastro-esophageal reflux disease without esophagitis: Secondary | ICD-10-CM | POA: Diagnosis not present

## 2022-10-21 DIAGNOSIS — I10 Essential (primary) hypertension: Secondary | ICD-10-CM | POA: Diagnosis not present

## 2022-11-01 DIAGNOSIS — E119 Type 2 diabetes mellitus without complications: Secondary | ICD-10-CM | POA: Diagnosis not present

## 2022-11-14 DIAGNOSIS — E119 Type 2 diabetes mellitus without complications: Secondary | ICD-10-CM | POA: Diagnosis not present

## 2022-11-29 DIAGNOSIS — G20A1 Parkinson's disease without dyskinesia, without mention of fluctuations: Secondary | ICD-10-CM | POA: Diagnosis not present

## 2022-11-29 DIAGNOSIS — Z7409 Other reduced mobility: Secondary | ICD-10-CM | POA: Diagnosis not present

## 2022-12-13 DIAGNOSIS — E119 Type 2 diabetes mellitus without complications: Secondary | ICD-10-CM | POA: Diagnosis not present

## 2023-01-12 DIAGNOSIS — G20A1 Parkinson's disease without dyskinesia, without mention of fluctuations: Secondary | ICD-10-CM | POA: Diagnosis not present

## 2023-01-12 DIAGNOSIS — Z7409 Other reduced mobility: Secondary | ICD-10-CM | POA: Diagnosis not present

## 2023-01-19 DIAGNOSIS — G20A1 Parkinson's disease without dyskinesia, without mention of fluctuations: Secondary | ICD-10-CM | POA: Diagnosis not present

## 2023-01-19 DIAGNOSIS — Z7409 Other reduced mobility: Secondary | ICD-10-CM | POA: Diagnosis not present

## 2023-01-20 DIAGNOSIS — S81002A Unspecified open wound, left knee, initial encounter: Secondary | ICD-10-CM | POA: Diagnosis not present

## 2023-01-20 DIAGNOSIS — E785 Hyperlipidemia, unspecified: Secondary | ICD-10-CM | POA: Diagnosis not present

## 2023-01-20 DIAGNOSIS — I10 Essential (primary) hypertension: Secondary | ICD-10-CM | POA: Diagnosis not present

## 2023-01-20 DIAGNOSIS — E119 Type 2 diabetes mellitus without complications: Secondary | ICD-10-CM | POA: Diagnosis not present

## 2023-01-20 DIAGNOSIS — E559 Vitamin D deficiency, unspecified: Secondary | ICD-10-CM | POA: Diagnosis not present

## 2023-01-20 DIAGNOSIS — K219 Gastro-esophageal reflux disease without esophagitis: Secondary | ICD-10-CM | POA: Diagnosis not present

## 2023-02-03 DIAGNOSIS — Z20822 Contact with and (suspected) exposure to covid-19: Secondary | ICD-10-CM | POA: Diagnosis not present

## 2023-02-03 DIAGNOSIS — U071 COVID-19: Secondary | ICD-10-CM | POA: Diagnosis not present

## 2023-02-03 DIAGNOSIS — R051 Acute cough: Secondary | ICD-10-CM | POA: Diagnosis not present

## 2023-03-02 DIAGNOSIS — Z7409 Other reduced mobility: Secondary | ICD-10-CM | POA: Diagnosis not present

## 2023-03-02 DIAGNOSIS — G20A1 Parkinson's disease without dyskinesia, without mention of fluctuations: Secondary | ICD-10-CM | POA: Diagnosis not present

## 2023-03-15 DIAGNOSIS — E119 Type 2 diabetes mellitus without complications: Secondary | ICD-10-CM | POA: Diagnosis not present

## 2023-03-23 DIAGNOSIS — G20A1 Parkinson's disease without dyskinesia, without mention of fluctuations: Secondary | ICD-10-CM | POA: Diagnosis not present

## 2023-03-23 DIAGNOSIS — Z7409 Other reduced mobility: Secondary | ICD-10-CM | POA: Diagnosis not present

## 2023-04-04 DIAGNOSIS — G243 Spasmodic torticollis: Secondary | ICD-10-CM | POA: Diagnosis not present

## 2023-04-07 DIAGNOSIS — I10 Essential (primary) hypertension: Secondary | ICD-10-CM | POA: Diagnosis not present

## 2023-04-07 DIAGNOSIS — E785 Hyperlipidemia, unspecified: Secondary | ICD-10-CM | POA: Diagnosis not present

## 2023-04-07 DIAGNOSIS — Z8249 Family history of ischemic heart disease and other diseases of the circulatory system: Secondary | ICD-10-CM | POA: Diagnosis not present

## 2023-04-10 DIAGNOSIS — G20A1 Parkinson's disease without dyskinesia, without mention of fluctuations: Secondary | ICD-10-CM | POA: Diagnosis not present

## 2023-04-10 DIAGNOSIS — Z7409 Other reduced mobility: Secondary | ICD-10-CM | POA: Diagnosis not present

## 2023-05-23 DIAGNOSIS — B3731 Acute candidiasis of vulva and vagina: Secondary | ICD-10-CM | POA: Diagnosis not present

## 2023-05-23 DIAGNOSIS — I10 Essential (primary) hypertension: Secondary | ICD-10-CM | POA: Diagnosis not present

## 2023-05-23 DIAGNOSIS — K219 Gastro-esophageal reflux disease without esophagitis: Secondary | ICD-10-CM | POA: Diagnosis not present

## 2023-05-23 DIAGNOSIS — E785 Hyperlipidemia, unspecified: Secondary | ICD-10-CM | POA: Diagnosis not present

## 2023-05-23 DIAGNOSIS — E559 Vitamin D deficiency, unspecified: Secondary | ICD-10-CM | POA: Diagnosis not present

## 2023-05-23 DIAGNOSIS — M79604 Pain in right leg: Secondary | ICD-10-CM | POA: Diagnosis not present

## 2023-05-23 DIAGNOSIS — J302 Other seasonal allergic rhinitis: Secondary | ICD-10-CM | POA: Diagnosis not present

## 2023-05-23 DIAGNOSIS — E119 Type 2 diabetes mellitus without complications: Secondary | ICD-10-CM | POA: Diagnosis not present

## 2023-05-25 DIAGNOSIS — M79604 Pain in right leg: Secondary | ICD-10-CM | POA: Diagnosis not present

## 2023-05-25 DIAGNOSIS — M79651 Pain in right thigh: Secondary | ICD-10-CM | POA: Diagnosis not present

## 2023-05-26 DIAGNOSIS — Z7984 Long term (current) use of oral hypoglycemic drugs: Secondary | ICD-10-CM | POA: Diagnosis not present

## 2023-05-26 DIAGNOSIS — Z7985 Long-term (current) use of injectable non-insulin antidiabetic drugs: Secondary | ICD-10-CM | POA: Diagnosis not present

## 2023-05-26 DIAGNOSIS — W19XXXA Unspecified fall, initial encounter: Secondary | ICD-10-CM | POA: Diagnosis not present

## 2023-05-26 DIAGNOSIS — S0993XA Unspecified injury of face, initial encounter: Secondary | ICD-10-CM | POA: Diagnosis not present

## 2023-05-26 DIAGNOSIS — S0990XA Unspecified injury of head, initial encounter: Secondary | ICD-10-CM | POA: Diagnosis not present

## 2023-05-26 DIAGNOSIS — Z79899 Other long term (current) drug therapy: Secondary | ICD-10-CM | POA: Diagnosis not present

## 2023-05-26 DIAGNOSIS — K219 Gastro-esophageal reflux disease without esophagitis: Secondary | ICD-10-CM | POA: Diagnosis not present

## 2023-05-26 DIAGNOSIS — Z794 Long term (current) use of insulin: Secondary | ICD-10-CM | POA: Diagnosis not present

## 2023-05-26 DIAGNOSIS — I10 Essential (primary) hypertension: Secondary | ICD-10-CM | POA: Diagnosis not present

## 2023-05-26 DIAGNOSIS — R079 Chest pain, unspecified: Secondary | ICD-10-CM | POA: Diagnosis not present

## 2023-05-26 DIAGNOSIS — Z7982 Long term (current) use of aspirin: Secondary | ICD-10-CM | POA: Diagnosis not present

## 2023-05-26 DIAGNOSIS — E119 Type 2 diabetes mellitus without complications: Secondary | ICD-10-CM | POA: Diagnosis not present

## 2023-05-27 DIAGNOSIS — W19XXXA Unspecified fall, initial encounter: Secondary | ICD-10-CM | POA: Diagnosis not present

## 2023-05-27 DIAGNOSIS — I10 Essential (primary) hypertension: Secondary | ICD-10-CM | POA: Diagnosis not present

## 2023-05-27 DIAGNOSIS — S0993XA Unspecified injury of face, initial encounter: Secondary | ICD-10-CM | POA: Diagnosis not present

## 2023-05-27 DIAGNOSIS — S0990XA Unspecified injury of head, initial encounter: Secondary | ICD-10-CM | POA: Diagnosis not present

## 2023-06-01 ENCOUNTER — Telehealth: Payer: Self-pay

## 2023-06-01 NOTE — Telephone Encounter (Signed)
Transition Care Management Unsuccessful Follow-up Telephone Call  Date of discharge and from where:  [facility] 6/15  Attempts:  1st Attempt  Reason for unsuccessful TCM follow-up call:  Left voice message   Lenard Forth [facility] Guide, [facility] [phone] [address] [place], [place], [place] [address] Phone: [phone] Email: [email or web address]

## 2023-06-05 ENCOUNTER — Telehealth: Payer: Self-pay

## 2023-06-05 NOTE — Telephone Encounter (Signed)
Transition Care Management Unsuccessful Follow-up Telephone Call  Date of discharge and from where:  [facility] 6/15  Attempts:  2nd Attempt  Reason for unsuccessful TCM follow-up call:  Unable to leave message   Lenard Forth [facility] Guide, [facility] [phone] [address] [place], [place], [place] [address] Phone: [phone] Email: [email or web address]

## 2023-06-13 DIAGNOSIS — E119 Type 2 diabetes mellitus without complications: Secondary | ICD-10-CM | POA: Diagnosis not present

## 2023-06-27 DIAGNOSIS — G243 Spasmodic torticollis: Secondary | ICD-10-CM | POA: Diagnosis not present

## 2023-07-10 DIAGNOSIS — E559 Vitamin D deficiency, unspecified: Secondary | ICD-10-CM | POA: Diagnosis not present

## 2023-07-10 DIAGNOSIS — R42 Dizziness and giddiness: Secondary | ICD-10-CM | POA: Diagnosis not present

## 2023-07-10 DIAGNOSIS — R5383 Other fatigue: Secondary | ICD-10-CM | POA: Diagnosis not present

## 2023-07-10 DIAGNOSIS — H8110 Benign paroxysmal vertigo, unspecified ear: Secondary | ICD-10-CM | POA: Diagnosis not present

## 2023-08-23 DIAGNOSIS — E559 Vitamin D deficiency, unspecified: Secondary | ICD-10-CM | POA: Diagnosis not present

## 2023-08-23 DIAGNOSIS — E1169 Type 2 diabetes mellitus with other specified complication: Secondary | ICD-10-CM | POA: Diagnosis not present

## 2023-08-23 DIAGNOSIS — I10 Essential (primary) hypertension: Secondary | ICD-10-CM | POA: Diagnosis not present

## 2023-08-23 DIAGNOSIS — Z0000 Encounter for general adult medical examination without abnormal findings: Secondary | ICD-10-CM | POA: Diagnosis not present

## 2023-08-23 DIAGNOSIS — K219 Gastro-esophageal reflux disease without esophagitis: Secondary | ICD-10-CM | POA: Diagnosis not present

## 2023-08-23 DIAGNOSIS — E782 Mixed hyperlipidemia: Secondary | ICD-10-CM | POA: Diagnosis not present

## 2023-08-23 DIAGNOSIS — Z9181 History of falling: Secondary | ICD-10-CM | POA: Diagnosis not present

## 2023-09-08 DIAGNOSIS — E119 Type 2 diabetes mellitus without complications: Secondary | ICD-10-CM | POA: Diagnosis not present

## 2023-09-11 DIAGNOSIS — R21 Rash and other nonspecific skin eruption: Secondary | ICD-10-CM | POA: Diagnosis not present

## 2023-09-22 DIAGNOSIS — M8588 Other specified disorders of bone density and structure, other site: Secondary | ICD-10-CM | POA: Diagnosis not present

## 2023-09-22 DIAGNOSIS — Z1231 Encounter for screening mammogram for malignant neoplasm of breast: Secondary | ICD-10-CM | POA: Diagnosis not present

## 2023-10-24 DIAGNOSIS — G243 Spasmodic torticollis: Secondary | ICD-10-CM | POA: Diagnosis not present

## 2023-11-22 DIAGNOSIS — G20A1 Parkinson's disease without dyskinesia, without mention of fluctuations: Secondary | ICD-10-CM | POA: Diagnosis not present

## 2023-12-21 DIAGNOSIS — R52 Pain, unspecified: Secondary | ICD-10-CM | POA: Diagnosis not present

## 2023-12-21 DIAGNOSIS — R509 Fever, unspecified: Secondary | ICD-10-CM | POA: Diagnosis not present

## 2023-12-21 DIAGNOSIS — E1122 Type 2 diabetes mellitus with diabetic chronic kidney disease: Secondary | ICD-10-CM | POA: Diagnosis not present

## 2023-12-21 DIAGNOSIS — J988 Other specified respiratory disorders: Secondary | ICD-10-CM | POA: Diagnosis not present

## 2023-12-21 DIAGNOSIS — R059 Cough, unspecified: Secondary | ICD-10-CM | POA: Diagnosis not present

## 2024-01-03 DIAGNOSIS — K219 Gastro-esophageal reflux disease without esophagitis: Secondary | ICD-10-CM | POA: Diagnosis not present

## 2024-01-03 DIAGNOSIS — E782 Mixed hyperlipidemia: Secondary | ICD-10-CM | POA: Diagnosis not present

## 2024-01-03 DIAGNOSIS — I129 Hypertensive chronic kidney disease with stage 1 through stage 4 chronic kidney disease, or unspecified chronic kidney disease: Secondary | ICD-10-CM | POA: Diagnosis not present

## 2024-01-03 DIAGNOSIS — E1122 Type 2 diabetes mellitus with diabetic chronic kidney disease: Secondary | ICD-10-CM | POA: Diagnosis not present

## 2024-01-23 DIAGNOSIS — G243 Spasmodic torticollis: Secondary | ICD-10-CM | POA: Diagnosis not present

## 2024-01-25 DIAGNOSIS — R220 Localized swelling, mass and lump, head: Secondary | ICD-10-CM | POA: Diagnosis not present

## 2024-01-29 DIAGNOSIS — R220 Localized swelling, mass and lump, head: Secondary | ICD-10-CM | POA: Diagnosis not present

## 2024-02-01 DIAGNOSIS — R220 Localized swelling, mass and lump, head: Secondary | ICD-10-CM | POA: Diagnosis not present

## 2024-02-13 DIAGNOSIS — H6121 Impacted cerumen, right ear: Secondary | ICD-10-CM | POA: Diagnosis not present

## 2024-02-17 DIAGNOSIS — R9431 Abnormal electrocardiogram [ECG] [EKG]: Secondary | ICD-10-CM | POA: Diagnosis not present

## 2024-02-17 DIAGNOSIS — E1165 Type 2 diabetes mellitus with hyperglycemia: Secondary | ICD-10-CM | POA: Diagnosis not present

## 2024-02-17 DIAGNOSIS — R221 Localized swelling, mass and lump, neck: Secondary | ICD-10-CM | POA: Diagnosis not present

## 2024-02-17 DIAGNOSIS — R220 Localized swelling, mass and lump, head: Secondary | ICD-10-CM | POA: Diagnosis not present

## 2024-02-17 DIAGNOSIS — R000 Tachycardia, unspecified: Secondary | ICD-10-CM | POA: Diagnosis not present

## 2024-02-17 DIAGNOSIS — K760 Fatty (change of) liver, not elsewhere classified: Secondary | ICD-10-CM | POA: Diagnosis not present

## 2024-02-17 DIAGNOSIS — R109 Unspecified abdominal pain: Secondary | ICD-10-CM | POA: Diagnosis not present

## 2024-02-17 DIAGNOSIS — E860 Dehydration: Secondary | ICD-10-CM | POA: Diagnosis not present

## 2024-02-18 DIAGNOSIS — R000 Tachycardia, unspecified: Secondary | ICD-10-CM | POA: Diagnosis not present

## 2024-02-18 DIAGNOSIS — E1165 Type 2 diabetes mellitus with hyperglycemia: Secondary | ICD-10-CM | POA: Diagnosis not present

## 2024-02-18 DIAGNOSIS — R221 Localized swelling, mass and lump, neck: Secondary | ICD-10-CM | POA: Diagnosis not present

## 2024-02-18 DIAGNOSIS — R9431 Abnormal electrocardiogram [ECG] [EKG]: Secondary | ICD-10-CM | POA: Diagnosis not present

## 2024-02-18 DIAGNOSIS — E860 Dehydration: Secondary | ICD-10-CM | POA: Diagnosis not present

## 2024-02-18 DIAGNOSIS — R220 Localized swelling, mass and lump, head: Secondary | ICD-10-CM | POA: Diagnosis not present

## 2024-02-18 DIAGNOSIS — R109 Unspecified abdominal pain: Secondary | ICD-10-CM | POA: Diagnosis not present

## 2024-02-22 DIAGNOSIS — E1122 Type 2 diabetes mellitus with diabetic chronic kidney disease: Secondary | ICD-10-CM | POA: Diagnosis not present

## 2024-02-22 DIAGNOSIS — R220 Localized swelling, mass and lump, head: Secondary | ICD-10-CM | POA: Diagnosis not present

## 2024-02-22 DIAGNOSIS — R739 Hyperglycemia, unspecified: Secondary | ICD-10-CM | POA: Diagnosis not present

## 2024-02-22 DIAGNOSIS — Z79899 Other long term (current) drug therapy: Secondary | ICD-10-CM | POA: Diagnosis not present

## 2024-02-23 DIAGNOSIS — E119 Type 2 diabetes mellitus without complications: Secondary | ICD-10-CM | POA: Diagnosis not present

## 2024-02-28 DIAGNOSIS — J351 Hypertrophy of tonsils: Secondary | ICD-10-CM | POA: Diagnosis not present

## 2024-02-28 DIAGNOSIS — R09A2 Foreign body sensation, throat: Secondary | ICD-10-CM | POA: Diagnosis not present

## 2024-02-28 DIAGNOSIS — R1310 Dysphagia, unspecified: Secondary | ICD-10-CM | POA: Diagnosis not present

## 2024-03-19 DIAGNOSIS — S6992XA Unspecified injury of left wrist, hand and finger(s), initial encounter: Secondary | ICD-10-CM | POA: Diagnosis not present

## 2024-03-19 DIAGNOSIS — S61512A Laceration without foreign body of left wrist, initial encounter: Secondary | ICD-10-CM | POA: Diagnosis not present

## 2024-03-19 DIAGNOSIS — Z23 Encounter for immunization: Secondary | ICD-10-CM | POA: Diagnosis not present

## 2024-04-04 DIAGNOSIS — E1122 Type 2 diabetes mellitus with diabetic chronic kidney disease: Secondary | ICD-10-CM | POA: Diagnosis not present

## 2024-04-04 DIAGNOSIS — K219 Gastro-esophageal reflux disease without esophagitis: Secondary | ICD-10-CM | POA: Diagnosis not present

## 2024-04-04 DIAGNOSIS — I129 Hypertensive chronic kidney disease with stage 1 through stage 4 chronic kidney disease, or unspecified chronic kidney disease: Secondary | ICD-10-CM | POA: Diagnosis not present

## 2024-04-04 DIAGNOSIS — E782 Mixed hyperlipidemia: Secondary | ICD-10-CM | POA: Diagnosis not present

## 2024-04-30 DIAGNOSIS — G243 Spasmodic torticollis: Secondary | ICD-10-CM | POA: Diagnosis not present

## 2024-05-24 DIAGNOSIS — I10 Essential (primary) hypertension: Secondary | ICD-10-CM | POA: Diagnosis not present

## 2024-05-24 DIAGNOSIS — Z8249 Family history of ischemic heart disease and other diseases of the circulatory system: Secondary | ICD-10-CM | POA: Diagnosis not present

## 2024-05-24 DIAGNOSIS — E785 Hyperlipidemia, unspecified: Secondary | ICD-10-CM | POA: Diagnosis not present

## 2024-06-03 DIAGNOSIS — E119 Type 2 diabetes mellitus without complications: Secondary | ICD-10-CM | POA: Diagnosis not present

## 2024-06-26 DIAGNOSIS — G243 Spasmodic torticollis: Secondary | ICD-10-CM | POA: Diagnosis not present

## 2024-07-11 DIAGNOSIS — K219 Gastro-esophageal reflux disease without esophagitis: Secondary | ICD-10-CM | POA: Diagnosis not present

## 2024-07-11 DIAGNOSIS — N1831 Chronic kidney disease, stage 3a: Secondary | ICD-10-CM | POA: Diagnosis not present

## 2024-07-11 DIAGNOSIS — I129 Hypertensive chronic kidney disease with stage 1 through stage 4 chronic kidney disease, or unspecified chronic kidney disease: Secondary | ICD-10-CM | POA: Diagnosis not present

## 2024-07-11 DIAGNOSIS — E782 Mixed hyperlipidemia: Secondary | ICD-10-CM | POA: Diagnosis not present

## 2024-07-11 DIAGNOSIS — E1122 Type 2 diabetes mellitus with diabetic chronic kidney disease: Secondary | ICD-10-CM | POA: Diagnosis not present

## 2024-07-25 DIAGNOSIS — G20A1 Parkinson's disease without dyskinesia, without mention of fluctuations: Secondary | ICD-10-CM | POA: Diagnosis not present

## 2024-08-06 DIAGNOSIS — Z79899 Other long term (current) drug therapy: Secondary | ICD-10-CM | POA: Diagnosis not present

## 2024-08-06 DIAGNOSIS — G243 Spasmodic torticollis: Secondary | ICD-10-CM | POA: Diagnosis not present

## 2024-08-26 DIAGNOSIS — R1310 Dysphagia, unspecified: Secondary | ICD-10-CM | POA: Diagnosis not present

## 2024-08-26 DIAGNOSIS — J351 Hypertrophy of tonsils: Secondary | ICD-10-CM | POA: Diagnosis not present

## 2024-08-26 DIAGNOSIS — R09A2 Foreign body sensation, throat: Secondary | ICD-10-CM | POA: Diagnosis not present

## 2024-08-29 DIAGNOSIS — Z01818 Encounter for other preprocedural examination: Secondary | ICD-10-CM | POA: Diagnosis not present

## 2024-08-29 DIAGNOSIS — E785 Hyperlipidemia, unspecified: Secondary | ICD-10-CM | POA: Diagnosis not present

## 2024-08-29 DIAGNOSIS — I10 Essential (primary) hypertension: Secondary | ICD-10-CM | POA: Diagnosis not present

## 2024-08-29 DIAGNOSIS — I129 Hypertensive chronic kidney disease with stage 1 through stage 4 chronic kidney disease, or unspecified chronic kidney disease: Secondary | ICD-10-CM | POA: Diagnosis not present

## 2024-08-29 DIAGNOSIS — E119 Type 2 diabetes mellitus without complications: Secondary | ICD-10-CM | POA: Diagnosis not present

## 2024-08-29 DIAGNOSIS — G243 Spasmodic torticollis: Secondary | ICD-10-CM | POA: Diagnosis not present

## 2024-08-29 DIAGNOSIS — K219 Gastro-esophageal reflux disease without esophagitis: Secondary | ICD-10-CM | POA: Diagnosis not present

## 2024-09-03 DIAGNOSIS — G20B2 Parkinson's disease with dyskinesia, with fluctuations: Secondary | ICD-10-CM | POA: Diagnosis not present

## 2024-09-03 DIAGNOSIS — Z880 Allergy status to penicillin: Secondary | ICD-10-CM | POA: Diagnosis not present

## 2024-09-03 DIAGNOSIS — E119 Type 2 diabetes mellitus without complications: Secondary | ICD-10-CM | POA: Diagnosis not present

## 2024-09-03 DIAGNOSIS — G20A1 Parkinson's disease without dyskinesia, without mention of fluctuations: Secondary | ICD-10-CM | POA: Diagnosis not present

## 2024-09-03 DIAGNOSIS — G2581 Restless legs syndrome: Secondary | ICD-10-CM | POA: Diagnosis not present

## 2024-09-03 DIAGNOSIS — G249 Dystonia, unspecified: Secondary | ICD-10-CM | POA: Diagnosis not present

## 2024-09-03 DIAGNOSIS — G243 Spasmodic torticollis: Secondary | ICD-10-CM | POA: Diagnosis not present

## 2024-09-03 DIAGNOSIS — Z7984 Long term (current) use of oral hypoglycemic drugs: Secondary | ICD-10-CM | POA: Diagnosis not present

## 2024-09-03 DIAGNOSIS — E7800 Pure hypercholesterolemia, unspecified: Secondary | ICD-10-CM | POA: Diagnosis not present

## 2024-09-03 DIAGNOSIS — I10 Essential (primary) hypertension: Secondary | ICD-10-CM | POA: Diagnosis not present

## 2024-09-05 DIAGNOSIS — G243 Spasmodic torticollis: Secondary | ICD-10-CM | POA: Diagnosis not present

## 2024-09-24 DIAGNOSIS — Z1231 Encounter for screening mammogram for malignant neoplasm of breast: Secondary | ICD-10-CM | POA: Diagnosis not present

## 2024-09-26 DIAGNOSIS — E119 Type 2 diabetes mellitus without complications: Secondary | ICD-10-CM | POA: Diagnosis not present

## 2024-09-26 DIAGNOSIS — I10 Essential (primary) hypertension: Secondary | ICD-10-CM | POA: Diagnosis not present

## 2024-09-26 DIAGNOSIS — G243 Spasmodic torticollis: Secondary | ICD-10-CM | POA: Diagnosis not present

## 2024-09-26 DIAGNOSIS — G709 Myoneural disorder, unspecified: Secondary | ICD-10-CM | POA: Diagnosis not present

## 2024-09-26 DIAGNOSIS — K219 Gastro-esophageal reflux disease without esophagitis: Secondary | ICD-10-CM | POA: Diagnosis not present

## 2024-09-26 DIAGNOSIS — G20B2 Parkinson's disease with dyskinesia, with fluctuations: Secondary | ICD-10-CM | POA: Diagnosis not present

## 2024-10-07 DIAGNOSIS — Z4801 Encounter for change or removal of surgical wound dressing: Secondary | ICD-10-CM | POA: Diagnosis not present

## 2024-11-11 NOTE — Progress Notes (Signed)
 Theresa Espinoza                                          MRN: [id number]   11/11/2024   The [facility] Quality Team Specialist reviewed this patient medical record for the purposes of chart review for care gap closure. The following were reviewed: chart review for care gap closure-kidney health evaluation for diabetes:eGFR  and uACR.    [facility] Quality Team

## 2024-12-25 NOTE — Progress Notes (Signed)
 Theresa Espinoza                                          MRN: [id number]   12/25/2024   The [facility] Quality Team Specialist reviewed this patient medical record for the purposes of chart review for care gap closure. The following were reviewed: abstraction for care gap closure-glycemic status assessment.    [facility] Quality Team
